# Patient Record
Sex: Male | Born: 1985 | Race: White | Hispanic: No | Marital: Married | Smoking: Current every day smoker
Health system: Southern US, Community
[De-identification: ages and names within clinical notes are randomized; demographics above are authoritative.]

## PROBLEM LIST (undated history)

## (undated) DIAGNOSIS — F32A Depression, unspecified: Secondary | ICD-10-CM

## (undated) DIAGNOSIS — F329 Major depressive disorder, single episode, unspecified: Secondary | ICD-10-CM

## (undated) HISTORY — DX: Major depressive disorder, single episode, unspecified: F32.9

## (undated) HISTORY — DX: Depression, unspecified: F32.A

## (undated) HISTORY — PX: NECK SURGERY: SHX720

---

## 2005-10-04 ENCOUNTER — Emergency Department (HOSPITAL_COMMUNITY): Admission: EM | Admit: 2005-10-04 | Discharge: 2005-10-04 | Payer: Self-pay | Admitting: Family Medicine

## 2006-12-07 ENCOUNTER — Emergency Department (HOSPITAL_COMMUNITY): Admission: EM | Admit: 2006-12-07 | Discharge: 2006-12-07 | Payer: Self-pay | Admitting: Emergency Medicine

## 2006-12-15 ENCOUNTER — Emergency Department (HOSPITAL_COMMUNITY): Admission: EM | Admit: 2006-12-15 | Discharge: 2006-12-15 | Payer: Self-pay | Admitting: Emergency Medicine

## 2007-02-21 ENCOUNTER — Emergency Department (HOSPITAL_COMMUNITY): Admission: EM | Admit: 2007-02-21 | Discharge: 2007-02-21 | Payer: Self-pay | Admitting: Emergency Medicine

## 2007-06-19 ENCOUNTER — Emergency Department (HOSPITAL_COMMUNITY): Admission: EM | Admit: 2007-06-19 | Discharge: 2007-06-19 | Payer: Self-pay | Admitting: Emergency Medicine

## 2011-09-23 LAB — COMPREHENSIVE METABOLIC PANEL
ALT: 19
AST: 32
Albumin: 4.3
CO2: 25
Calcium: 8.6
Creatinine, Ser: 0.96
GFR calc Af Amer: >60
Sodium: 137
Total Bilirubin: 0.6
Total Protein: 7.2

## 2011-09-23 LAB — DIFFERENTIAL
Basophils Absolute: 0
Basophils Relative: 0
Neutro Abs: 3.7
Neutrophils Relative %: 58

## 2011-09-23 LAB — URINALYSIS, ROUTINE W REFLEX MICROSCOPIC
Leukocytes, UA: NEGATIVE
Protein, ur: NEGATIVE
Urobilinogen, UA: 0.2

## 2011-09-23 LAB — LIPASE, BLOOD: Lipase: 19

## 2011-09-23 LAB — URINE MICROSCOPIC-ADD ON

## 2011-09-23 LAB — CBC
MCHC: 34.6
Platelets: 233
RBC: 5.39
WBC: 6.4

## 2015-08-07 ENCOUNTER — Ambulatory Visit (INDEPENDENT_AMBULATORY_CARE_PROVIDER_SITE_OTHER): Payer: Self-pay | Admitting: Family Medicine

## 2015-08-07 VITALS — BP 100/66 | HR 108 | Temp 98.1°F | Resp 16 | Ht 71.0 in | Wt 171.2 lb

## 2015-08-07 DIAGNOSIS — N50819 Testicular pain, unspecified: Secondary | ICD-10-CM

## 2015-08-07 DIAGNOSIS — N508 Other specified disorders of male genital organs: Secondary | ICD-10-CM

## 2015-08-07 DIAGNOSIS — R1030 Lower abdominal pain, unspecified: Secondary | ICD-10-CM

## 2015-08-07 DIAGNOSIS — T148XXA Other injury of unspecified body region, initial encounter: Secondary | ICD-10-CM

## 2015-08-07 DIAGNOSIS — T148 Other injury of unspecified body region: Secondary | ICD-10-CM

## 2015-08-07 LAB — POCT UA - MICROSCOPIC ONLY
Bacteria, U Microscopic: NEGATIVE
Casts, Ur, LPF, POC: NEGATIVE
Crystals, Ur, HPF, POC: NEGATIVE
Epithelial cells, urine per micros: NEGATIVE
Mucus, UA: NEGATIVE
RBC, urine, microscopic: NEGATIVE
WBC, Ur, HPF, POC: NEGATIVE
Yeast, UA: NEGATIVE

## 2015-08-07 LAB — POCT URINALYSIS DIPSTICK
Bilirubin, UA: NEGATIVE
Blood, UA: NEGATIVE
Glucose, UA: NEGATIVE
Ketones, UA: NEGATIVE
Leukocytes, UA: NEGATIVE
Nitrite, UA: NEGATIVE
Protein, UA: NEGATIVE
Spec Grav, UA: 1.01
Urobilinogen, UA: 0.2
pH, UA: 7
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
## 2015-09-11 ENCOUNTER — Telehealth: Payer: Self-pay

## 2015-09-11 DIAGNOSIS — N50811 Right testicular pain: Secondary | ICD-10-CM

## 2015-09-11 NOTE — Telephone Encounter (Signed)
Dr. Conley Rolls, patient states he is not better. Testicles still painful. Wants to know what do next. Cb# [phone].

## 2015-09-12 NOTE — Telephone Encounter (Signed)
Spok ewith patient, intermittent right testicular pain. No n/v/, torsion sxs.

## 2015-11-29 ENCOUNTER — Encounter (HOSPITAL_COMMUNITY): Payer: Self-pay | Admitting: *Deleted

## 2015-11-29 ENCOUNTER — Emergency Department (HOSPITAL_COMMUNITY)
Admission: EM | Admit: 2015-11-29 | Discharge: 2015-11-29 | Disposition: A | Discharge status: Home / Self Care | Payer: Self-pay | Attending: Emergency Medicine | Admitting: Emergency Medicine

## 2015-11-29 DIAGNOSIS — W260XXA Contact with knife, initial encounter: Secondary | ICD-10-CM | POA: Insufficient documentation

## 2015-11-29 DIAGNOSIS — S61214A Laceration without foreign body of right ring finger without damage to nail, initial encounter: Secondary | ICD-10-CM | POA: Insufficient documentation

## 2015-11-29 DIAGNOSIS — S61212A Laceration without foreign body of right middle finger without damage to nail, initial encounter: Secondary | ICD-10-CM | POA: Insufficient documentation

## 2015-11-29 DIAGNOSIS — F17200 Nicotine dependence, unspecified, uncomplicated: Secondary | ICD-10-CM | POA: Insufficient documentation

## 2015-11-29 DIAGNOSIS — Y9389 Activity, other specified: Secondary | ICD-10-CM | POA: Insufficient documentation

## 2015-11-29 DIAGNOSIS — Y999 Unspecified external cause status: Secondary | ICD-10-CM | POA: Insufficient documentation

## 2015-11-29 DIAGNOSIS — S61219A Laceration without foreign body of unspecified finger without damage to nail, initial encounter: Secondary | ICD-10-CM

## 2015-11-29 DIAGNOSIS — Z8659 Personal history of other mental and behavioral disorders: Secondary | ICD-10-CM | POA: Insufficient documentation

## 2015-11-29 DIAGNOSIS — Z79899 Other long term (current) drug therapy: Secondary | ICD-10-CM | POA: Insufficient documentation

## 2015-11-29 DIAGNOSIS — Y9289 Other specified places as the place of occurrence of the external cause: Secondary | ICD-10-CM | POA: Insufficient documentation

## 2015-11-29 MED ORDER — LIDOCAINE HCL (PF) 1 % IJ SOLN
10.0000 mL | Freq: Once | INTRAMUSCULAR | Status: AC
  Administered 2015-11-29: 10 mL
  Filled 2015-11-29: qty 10
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
## 2015-11-29 NOTE — ED Notes (Signed)
Unable to obtain an oral temp
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
## 2015-11-29 NOTE — ED Notes (Signed)
ED PA at the bedside 

## 2015-11-29 NOTE — Discharge Instructions (Signed)
Sutured Wound Care Sutures are stitches that can be used to close wounds. Taking care of your wound properly can help to prevent pain and infection. It can also help your wound to heal more quickly. HOW TO CARE FOR YOUR SUTURED WOUND Wound Care  Keep the wound clean and dry.  If you were given a bandage (dressing), you should change it at least once per day or as directed by your health care provider. You should also change it if it becomes wet or dirty.  Keep the wound completely dry for the first 24 hours or as directed by your health care provider. After that time, you may shower or bathe. However, make sure that the wound is not soaked in water until the sutures have been removed.  Clean the wound one time each day or as directed by your health care provider.  Wash the wound with soap and water.  Rinse the wound with water to remove all soap.  Pat the wound dry with a clean towel. Do not rub the wound.  Aftercleaning the wound, apply a thin layer of antibioticointment as directed by your health care provider. This will help to prevent infection and keep the dressing from sticking to the wound.  Have the sutures removed as directed by your health care provider. General Instructions  Take or apply medicines only as directed by your health care provider.  To help prevent scarring, make sure to cover your wound with sunscreen whenever you are outside after the sutures are removed and the wound is healed. Make sure to wear a sunscreen of at least 30 SPF.  If you were prescribed an antibiotic medicine or ointment, finish all of it even if you start to feel better.  Do not scratch or pick at the wound.  Keep all follow-up visits as directed by your health care provider. This is important.  Check your wound every day for signs of infection. Watch for:   Redness, swelling, or pain.  Fluid, blood, or pus.  Raise (elevate) the injured area above the level of your heart while you  are sitting or lying down, if possible.  Avoid stretching your wound.  Drink enough fluids to keep your urine clear or pale yellow. SEEK MEDICAL CARE IF:  You received a tetanus shot and you have swelling, severe pain, redness, or bleeding at the injection site.  You have a fever.  A wound that was closed breaks open.  You notice a bad smell coming from the wound.  You notice something coming out of the wound, such as wood or glass.  Your pain is not controlled with medicine.  You have increased redness, swelling, or pain at the site of your wound.  You have fluid, blood, or pus coming from your wound.  You notice a change in the color of your skin near your wound.  You need to change the dressing frequently due to fluid, blood, or pus draining from the wound.  You develop a new rash.  You develop numbness around the wound. SEEK IMMEDIATE MEDICAL CARE IF:  You develop severe swelling around the injury site.  Your pain suddenly increases and is severe.  You develop painful lumps near the wound or on skin that is anywhere on your body.  You have a red streak going away from your wound.  The wound is on your hand or foot and you cannot properly move a finger or toe.  The wound is on your hand or foot and   you notice that your fingers or toes look pale or bluish.   This information is not intended to replace advice given to you by your health care provider. Make sure you discuss any questions you have with your health care provider.   Document Released: 01/01/2005 Document Revised: 04/10/2015 Document Reviewed: 07/06/2013 Elsevier Interactive Patient Education 2016 [employer]  

## 2015-12-09 HISTORY — PX: NECK SURGERY: SHX720

## 2016-10-08 ENCOUNTER — Encounter (HOSPITAL_COMMUNITY): Payer: Self-pay | Admitting: Emergency Medicine

## 2016-10-08 ENCOUNTER — Ambulatory Visit (HOSPITAL_COMMUNITY)
Admission: EM | Admit: 2016-10-08 | Discharge: 2016-10-08 | Disposition: A | Discharge status: Home / Self Care | Payer: PRIVATE HEALTH INSURANCE | Attending: Family Medicine | Admitting: Family Medicine

## 2016-10-08 DIAGNOSIS — Z23 Encounter for immunization: Secondary | ICD-10-CM | POA: Diagnosis not present

## 2016-10-08 DIAGNOSIS — S61211A Laceration without foreign body of left index finger without damage to nail, initial encounter: Secondary | ICD-10-CM

## 2016-10-08 MED ORDER — TETANUS-DIPHTH-ACELL PERTUSSIS 5-2.5-18.5 LF-MCG/0.5 IM SUSP
INTRAMUSCULAR | Status: AC
  Filled 2016-10-08: qty 0.5

## 2016-10-08 MED ORDER — TETANUS-DIPHTH-ACELL PERTUSSIS 5-2.5-18.5 LF-MCG/0.5 IM SUSP
0.5000 mL | Freq: Once | INTRAMUSCULAR | Status: AC
  Administered 2016-10-08: 0.5 mL via INTRAMUSCULAR

## 2016-10-08 MED ORDER — CEPHALEXIN 500 MG PO CAPS: 500.0000 mg | ORAL_CAPSULE | Freq: Four times a day (QID) | ORAL | 0 refills | Status: DC

## 2016-10-08 NOTE — ED Triage Notes (Signed)
[facility] with a complaint of a laceration to the first knuckle on his left hand that occurred yesterday with a knife. Bleeding was controlled upon arrival.

## 2016-10-08 NOTE — Discharge Instructions (Signed)
Keep clean and dry. Watch for signs of infection, increased redness, pus, pain, swelling, red streaks, loss of function of the finger. If any of these things occur seek medical attention immediately. Take the antibiotics as directed. Keep the splint on the finger for 3-4 days. Keep it covered while working so as not to contaminate it.
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
## 2016-11-05 ENCOUNTER — Encounter (HOSPITAL_COMMUNITY): Payer: Self-pay

## 2016-11-05 ENCOUNTER — Emergency Department (HOSPITAL_COMMUNITY)
Admission: EM | Admit: 2016-11-05 | Discharge: 2016-11-05 | Disposition: A | Discharge status: Home / Self Care | Payer: Medicaid - Out of State | Attending: Emergency Medicine | Admitting: Emergency Medicine

## 2016-11-05 ENCOUNTER — Emergency Department (HOSPITAL_COMMUNITY): Payer: Medicaid - Out of State

## 2016-11-05 DIAGNOSIS — Z5321 Procedure and treatment not carried out due to patient leaving prior to being seen by health care provider: Secondary | ICD-10-CM | POA: Insufficient documentation

## 2016-11-05 DIAGNOSIS — R112 Nausea with vomiting, unspecified: Secondary | ICD-10-CM | POA: Insufficient documentation

## 2016-11-05 DIAGNOSIS — R2232 Localized swelling, mass and lump, left upper limb: Secondary | ICD-10-CM | POA: Insufficient documentation

## 2016-11-05 LAB — CBC WITH DIFFERENTIAL/PLATELET
BASOS ABS: 0 10*3/uL (ref 0.0–0.1)
Basophils Relative: 0 %
EOS ABS: 0.1 10*3/uL (ref 0.0–0.7)
EOS PCT: 1 %
HCT: 48.7 % (ref 39.0–52.0)
Hemoglobin: 17.7 g/dL — ABNORMAL HIGH (ref 13.0–17.0)
LYMPHS PCT: 12 %
Lymphs Abs: 2 10*3/uL (ref 0.7–4.0)
MCH: 33 pg (ref 26.0–34.0)
MCHC: 36.3 g/dL — ABNORMAL HIGH (ref 30.0–36.0)
MCV: 90.7 fL (ref 78.0–100.0)
MONO ABS: 1.2 10*3/uL — AB (ref 0.1–1.0)
Monocytes Relative: 7 %
Neutro Abs: 12.7 10*3/uL — ABNORMAL HIGH (ref 1.7–7.7)
Neutrophils Relative %: 80 %
PLATELETS: 198 10*3/uL (ref 150–400)
RBC: 5.37 MIL/uL (ref 4.22–5.81)
RDW: 13.4 % (ref 11.5–15.5)
WBC: 16 10*3/uL — AB (ref 4.0–10.5)

## 2016-11-05 LAB — COMPREHENSIVE METABOLIC PANEL
ALBUMIN: 4.6 g/dL (ref 3.5–5.0)
ALK PHOS: 63 U/L (ref 38–126)
ALT: 35 U/L (ref 17–63)
AST: 46 U/L — AB (ref 15–41)
Anion gap: 13 (ref 5–15)
BILIRUBIN TOTAL: 1.1 mg/dL (ref 0.3–1.2)
BUN: 8 mg/dL (ref 6–20)
CO2: 22 mmol/L (ref 22–32)
CREATININE: 0.81 mg/dL (ref 0.61–1.24)
Calcium: 9.6 mg/dL (ref 8.9–10.3)
Chloride: 100 mmol/L — ABNORMAL LOW (ref 101–111)
GFR calc Af Amer: >60 mL/min (ref >60)
GLUCOSE: 134 mg/dL — AB (ref 65–99)
POTASSIUM: 3.2 mmol/L — AB (ref 3.5–5.1)
Sodium: 135 mmol/L (ref 135–145)
TOTAL PROTEIN: 7.4 g/dL (ref 6.5–8.1)

## 2016-11-05 LAB — I-STAT CG4 LACTIC ACID, ED: LACTIC ACID, VENOUS: 2.81 mmol/L — AB (ref 0.5–1.9)

## 2016-11-05 NOTE — ED Notes (Signed)
Called to inform patient test resulted are completed and recommend to follow up with a Doctor. Patient thanked nurse for calling him and will follow up.

## 2016-11-05 NOTE — ED Triage Notes (Signed)
Patient here with left hand index finger swelling, redness and drainage for several days. Patient had laceration to same 1 month ago after cutting on knife. Now is having nausea and vomiting and unsure if related. Left hand warm to touch, no chills. Alert and oriented

## 2016-11-05 NOTE — ED Notes (Signed)
Called for patient in waiting room no answer.

## 2016-11-05 NOTE — ED Notes (Addendum)
Charge nurse at desk patient stated unable to waiting anymore and needs to leave. Nurse first assisting another patient. Nurse first called for patient no answer in waiting room.

## 2017-01-28 ENCOUNTER — Encounter (HOSPITAL_COMMUNITY): Payer: Self-pay | Admitting: *Deleted

## 2017-01-28 ENCOUNTER — Emergency Department (HOSPITAL_COMMUNITY)
Admission: EM | Admit: 2017-01-28 | Discharge: 2017-01-28 | Disposition: A | Discharge status: Home / Self Care | Payer: PRIVATE HEALTH INSURANCE | Attending: Emergency Medicine | Admitting: Emergency Medicine

## 2017-01-28 ENCOUNTER — Emergency Department (HOSPITAL_COMMUNITY): Payer: PRIVATE HEALTH INSURANCE

## 2017-01-28 DIAGNOSIS — Y939 Activity, unspecified: Secondary | ICD-10-CM | POA: Insufficient documentation

## 2017-01-28 DIAGNOSIS — S62639A Displaced fracture of distal phalanx of unspecified finger, initial encounter for closed fracture: Secondary | ICD-10-CM

## 2017-01-28 DIAGNOSIS — W230XXA Caught, crushed, jammed, or pinched between moving objects, initial encounter: Secondary | ICD-10-CM | POA: Insufficient documentation

## 2017-01-28 DIAGNOSIS — Y929 Unspecified place or not applicable: Secondary | ICD-10-CM | POA: Insufficient documentation

## 2017-01-28 DIAGNOSIS — Z79899 Other long term (current) drug therapy: Secondary | ICD-10-CM | POA: Insufficient documentation

## 2017-01-28 DIAGNOSIS — Y999 Unspecified external cause status: Secondary | ICD-10-CM | POA: Insufficient documentation

## 2017-01-28 DIAGNOSIS — S62632A Displaced fracture of distal phalanx of right middle finger, initial encounter for closed fracture: Secondary | ICD-10-CM | POA: Insufficient documentation

## 2017-01-28 DIAGNOSIS — F17210 Nicotine dependence, cigarettes, uncomplicated: Secondary | ICD-10-CM | POA: Insufficient documentation

## 2017-01-28 MED ORDER — OXYCODONE-ACETAMINOPHEN 5-325 MG PO TABS: 1.0000 | ORAL_TABLET | Freq: Once | ORAL | Status: DC

## 2017-01-28 MED ORDER — HYDROCODONE-ACETAMINOPHEN 5-325 MG PO TABS
1.0000 | ORAL_TABLET | Freq: Four times a day (QID) | ORAL | 0 refills | Status: AC | PRN
Start: 2017-01-28 — End: ?

## 2017-01-28 MED ORDER — CEPHALEXIN 500 MG PO CAPS: 500.0000 mg | ORAL_CAPSULE | Freq: Four times a day (QID) | ORAL | 0 refills | Status: AC

## 2017-01-28 MED ORDER — OXYCODONE-ACETAMINOPHEN 5-325 MG PO TABS
ORAL_TABLET | ORAL | Status: AC
  Filled 2017-01-28: qty 1

## 2017-01-28 MED ORDER — OXYCODONE-ACETAMINOPHEN 5-325 MG PO TABS
1.0000 | ORAL_TABLET | Freq: Once | ORAL | Status: AC
  Administered 2017-01-28: 1 via ORAL
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
## 2017-01-28 NOTE — ED Notes (Signed)
ED Provider at bedside. 

## 2017-01-28 NOTE — Progress Notes (Signed)
Orthopedic Tech Progress Note Patient Details:  John SchwalbeGordon Cordova 03-11-1986 [id number]  Ortho Devices Type of Ortho Device: Finger splint Ortho Device/Splint Location: RUE middle finger Ortho Device/Splint Interventions: Ordered, Application   [name], [name] 01/28/2017, 8:20 PM

## 2017-01-28 NOTE — ED Provider Notes (Signed)
[facility] Note   CSN: [id number] Arrival date & time: 01/28/17  1829  By signing my name below, I, John Cordova, attest that this documentation has been prepared under the direction and in the presence of [name], NP. Electronically Signed: Alyssa GroveMartin Cordova, ED Scribe. 01/28/17. 7:35 PM.  History   Chief Complaint Chief Complaint  Patient presents with  . Finger Injury   The history is provided by the patient. No language interpreter was used.   HPI Comments: John Cordova is a 31 y.o. male who presents to the Emergency Department complaining of constant, moderate right middle finger pain s/p injury earlier today. Pt's finger was "smashed with a hammer". He placed a hole through his nail in order to drain the blood under his nail which provided moderate relief to pain. Pt reports associated swelling and color change.   Past Medical History:  Diagnosis Date  . Depression     There are no active problems to display for this patient.   History reviewed. No pertinent surgical history.     Home Medications    Prior to Admission medications   Medication Sig Start Date End Date Taking? Authorizing Provider  Ascorbic Acid (VITAMIN C PO) Take 1 tablet by mouth daily.    Historical Provider, MD  cephALEXin (KEFLEX) 500 MG capsule Take 1 capsule (500 mg total) by mouth 4 (four) times daily. 01/28/17   Hope Orlene OchM [name], NP  Cholecalciferol (VITAMIN D PO) Take 1 tablet by mouth daily.    Historical Provider, MD  HYDROcodone-acetaminophen (NORCO) 5-325 MG tablet Take 1 tablet by mouth every 6 (six) hours as needed. 01/28/17   Hope Orlene OchM [name], NP  MAGNESIUM PO Take 1 tablet by mouth daily.    Historical Provider, MD  Multiple Vitamin (MULTIVITAMIN WITH MINERALS) TABS tablet Take 1 tablet by mouth daily.    Historical Provider, MD  NIACIN PO Take 1 tablet by mouth daily.    Historical Provider, MD  VITAMIN E PO Take 1 tablet by mouth daily.    Historical Provider, MD     Family History No family history on file.  Social History Social History  Substance Use Topics  . Smoking status: Current Every Day Smoker    Packs/day: 1.00    Types: Cigarettes  . Smokeless tobacco: Never Used  . Alcohol use No     Allergies   Patient has no known allergies.   Review of Systems Review of Systems  Musculoskeletal: Positive for arthralgias and joint swelling.  Skin: Positive for color change and wound.  All other systems reviewed and are negative.   Physical Exam Updated Vital Signs BP 138/72 (BP Location: Left Arm)   Pulse 81   Temp 98.2 F (36.8 C) (Oral)   Resp 18   Ht 5\' 11"  (1.803 m)   Wt 78.5 kg   SpO2 98%   BMI 24.13 kg/m   Physical Exam  Constitutional: He is oriented to person, place, and time. He appears well-developed and well-nourished. He is active. No distress.  HENT:  Head: Normocephalic and atraumatic.  Eyes: Conjunctivae are normal.  Cardiovascular: Normal rate.   Adequate circulation Subungual hematoma that he drained   Pulmonary/Chest: Effort normal. No respiratory distress.  Musculoskeletal:       Right hand: He exhibits tenderness, bony tenderness and swelling. He exhibits normal capillary refill. Decreased range of motion: due to pain. Normal sensation noted. Normal strength noted.  Middle finger of right hand with subungual hematoma that patient  has drained himself. Good strength. No focal neuro deficits.   Neurological: He is alert and oriented to person, place, and time.  Skin: Skin is warm and dry.  Psychiatric: He has a normal mood and affect. His behavior is normal.  Nursing note and vitals reviewed.   ED Treatments / Results  DIAGNOSTIC STUDIES: Oxygen Saturation is 100% on RA, normal by my interpretation.    COORDINATION OF CARE: 7:29 PM Discussed treatment plan with pt at bedside which includes referral to hand and antibiotics and pt agreed to plan.  Labs (all labs ordered are listed, but only  abnormal results are displayed) Labs Reviewed - No data to display  Radiology No results found.  Procedures Procedures (including critical care time)  Medications Ordered in ED Medications  oxyCODONE-acetaminophen (PERCOCET/ROXICET) 5-325 MG per tablet 1 tablet (1 tablet Oral Given 01/28/17 1853)     Initial Impression / Assessment and Plan / ED Course  I have reviewed the triage vital signs and the nursing notes.  Pertinent imaging results that were available during my care of the patient were reviewed by me and considered in my medical decision making (see chart for details).   I personally performed the services described in this documentation, which was scribed in my presence. The recorded information has been reviewed and is accurate  Final Clinical Impressions(s) / ED Diagnoses  31 y.o. male with pain and swelling of the right middle finger after he hit his finger with a hammer while at work stable for d/c without focal neuro deficits. Finger splinted and pain management, he will elevate and ice the area. Referral to hand.  Final diagnoses:  Closed fracture of tuft of distal phalanx of finger    New Prescriptions Discharge Medication List as of 01/28/2017  7:56 PM    START taking these medications   Details  HYDROcodone-acetaminophen (NORCO) 5-325 MG tablet Take 1 tablet by mouth every 6 (six) hours as needed., Starting Wed 01/28/2017, Print         [place], NP 02/01/17 1653    Arby Barrette, MD 02/02/17 1630

## 2017-01-28 NOTE — Discharge Instructions (Signed)
You can take Advil in addition to the narcotic pain medication. Do not drive while taking the narcotic as it will make you sleepy. Call Dr. Glenna Durandrtmann's office for follow up. Return here as needed.

## 2017-04-30 ENCOUNTER — Encounter (HOSPITAL_COMMUNITY): Payer: Self-pay

## 2017-04-30 DIAGNOSIS — Y9389 Activity, other specified: Secondary | ICD-10-CM | POA: Diagnosis not present

## 2017-04-30 DIAGNOSIS — Y929 Unspecified place or not applicable: Secondary | ICD-10-CM | POA: Diagnosis not present

## 2017-04-30 DIAGNOSIS — Y990 Civilian activity done for income or pay: Secondary | ICD-10-CM | POA: Diagnosis not present

## 2017-04-30 DIAGNOSIS — S0591XA Unspecified injury of right eye and orbit, initial encounter: Secondary | ICD-10-CM | POA: Insufficient documentation

## 2017-04-30 DIAGNOSIS — W274XXA Contact with kitchen utensil, initial encounter: Secondary | ICD-10-CM | POA: Insufficient documentation

## 2017-04-30 MED ORDER — FLUORESCEIN SODIUM 0.6 MG OP STRP: 1.0000 | ORAL_STRIP | Freq: Once | OPHTHALMIC | Status: DC

## 2017-04-30 MED ORDER — TETRACAINE HCL 0.5 % OP SOLN: 2.0000 [drp] | Freq: Once | OPHTHALMIC | Status: DC

## 2017-04-30 NOTE — ED Notes (Signed)
Asking how much longer wait

## 2017-04-30 NOTE — ED Triage Notes (Signed)
Pt reports right eye irritation and redness for three days after using a grinder at work. States possible metal or brick could have gotten into his eye. He reports pain with blinking.

## 2017-05-01 ENCOUNTER — Emergency Department (HOSPITAL_COMMUNITY)
Admission: EM | Admit: 2017-05-01 | Discharge: 2017-05-01 | Disposition: A | Discharge status: Home / Self Care | Payer: Medicaid - Out of State | Attending: Emergency Medicine | Admitting: Emergency Medicine

## 2017-05-01 NOTE — ED Notes (Signed)
No answer when called x 3.  

## 2017-05-05 ENCOUNTER — Emergency Department (HOSPITAL_COMMUNITY)
Admission: EM | Admit: 2017-05-05 | Discharge: 2017-05-06 | Disposition: A | Discharge status: Home / Self Care | Payer: PRIVATE HEALTH INSURANCE | Attending: Emergency Medicine | Admitting: Emergency Medicine

## 2017-05-05 ENCOUNTER — Encounter (HOSPITAL_COMMUNITY): Payer: Self-pay | Admitting: Emergency Medicine

## 2017-05-05 DIAGNOSIS — H5711 Ocular pain, right eye: Secondary | ICD-10-CM | POA: Insufficient documentation

## 2017-05-05 MED ORDER — FLUORESCEIN SODIUM 0.6 MG OP STRP
1.0000 | ORAL_STRIP | Freq: Once | OPHTHALMIC | Status: DC
  Filled 2017-05-05: qty 1

## 2017-05-05 MED ORDER — TETRACAINE HCL 0.5 % OP SOLN
2.0000 [drp] | Freq: Once | OPHTHALMIC | Status: DC
  Filled 2017-05-05: qty 2

## 2017-05-05 NOTE — ED Triage Notes (Signed)
Pt c/o pain to right eye x's 1 week after using a grinder at work.  Pt st's he was here a few days ago but left due to the wait time and thought it would get better.  Pt st's not any better

## 2017-05-05 NOTE — ED Provider Notes (Cosign Needed)
[facility] Provider Note   CSN: [id number] Arrival date & time: 05/05/17  2149  By signing my name below, I, John Cordova, attest that this documentation has been prepared under the direction and in the presence of non-physician practitioner, Kerrie Buffalo, NP. Electronically Signed: Rosana Cordova, ED Scribe. 05/05/17. 10:51 PM.  History   Chief Complaint Chief Complaint  Patient presents with  . Eye Pain   The history is provided by the patient. No language interpreter was used.  Eye Pain    HPI Comments: John Cordova is a 31 y.o. male who presents to the Emergency Department complaining of constant, gradually worsening right eye pain onset 1 week ago. Pt states he may have a foreign body of glass or brick in his eye. Pt was here a couple of days ago but left due to the wait time and was not seen. Pt reports associated visual disturbance. No treatments tried prior to arrival in the ED. Tetanus is UTD. No other complaints at this time.  Past Medical History:  Diagnosis Date  . Depression     There are no active problems to display for this patient.   Past Surgical History:  Procedure Laterality Date  . NECK SURGERY  2017   repair of laceration/arterial repair       Home Medications    Prior to Admission medications   Medication Sig Start Date End Date Taking? Authorizing Provider  Ascorbic Acid (VITAMIN C PO) Take 1 tablet by mouth daily.    [provider]  cephALEXin (KEFLEX) 500 MG capsule Take 1 capsule (500 mg total) by mouth 4 (four) times daily. 01/28/17   John Napoleon, NP  Cholecalciferol (VITAMIN D PO) Take 1 tablet by mouth daily.    [provider]  HYDROcodone-acetaminophen (NORCO) 5-325 MG tablet Take 1 tablet by mouth every 6 (six) hours as needed. 01/28/17   John Napoleon, NP  MAGNESIUM PO Take 1 tablet by mouth daily.    [provider]  Multiple Vitamin (MULTIVITAMIN WITH MINERALS) TABS tablet Take 1 tablet by mouth  daily.    [provider]  NIACIN PO Take 1 tablet by mouth daily.    [provider]  VITAMIN E PO Take 1 tablet by mouth daily.    [provider]    Family History No family history on file.  Social History Social History  Substance Use Topics  . Smoking status: Current Every Day Smoker    Packs/day: 1.00    Types: Cigarettes  . Smokeless tobacco: Never Used  . Alcohol use No     Allergies   Patient has no known allergies.   Review of Systems Review of Systems  Eyes: Positive for photophobia, pain, discharge, redness and visual disturbance.  Gastrointestinal: Negative for nausea.  Neurological: Negative for syncope.     Physical Exam Updated Vital Signs BP 135/86   Pulse 94   Temp 98.5 F (36.9 C) (Oral)   Resp 18   Ht 5' 11.5" (1.816 m)   Wt 165 lb (74.8 kg)   SpO2 99%   BMI 22.69 kg/m   Physical Exam  Constitutional: He appears well-developed and well-nourished. No distress.  HENT:  Head: Normocephalic and atraumatic.  Eyes: EOM are normal. Right conjunctiva is injected.  Cardiovascular: Normal rate.   Pulmonary/Chest: Effort normal.  Neurological: He is alert.  Skin: Skin is warm and dry.  Psychiatric: He has a normal mood and affect.  Nursing note and vitals reviewed.  ED Treatments / Results  DIAGNOSTIC STUDIES: Oxygen Saturation is 99% on RA, normal by my interpretation.   COORDINATION OF CARE: 10:50 PM-Discussed next steps with pt including visual acuity, using eye drop to stop the pain, eye exam with slit lamp and staining the eye to look for corneal abrasion and/or foreign body. Pt verbalized understanding and is agreeable with the plan.   11:45 PM Medications arrived from pharmacy, return to the pt's room. Upon arrival, the pt was no longer in his room.   Labs (all labs ordered are listed, but only abnormal results are displayed) Labs Reviewed - No data to display  Radiology No results  found.  Procedures Procedures (including critical care time)  Medications Ordered in ED Medications - No data to display   Initial Impression / Assessment and Plan / ED Course  I have reviewed the triage vital signs and the nursing notes.  Final Clinical Impressions(s) / ED Diagnoses  Right eye redness and pain. Patient left prior to eye exam.  New Prescriptions New Prescriptions   No medications on file  I personally performed the services described in this documentation, which was scribed in my presence. The recorded information has been reviewed and is accurate.    Kerrie Buffaloeese, [place] Windthorst[place], [place] 05/06/17 [phone]

## 2017-05-06 ENCOUNTER — Encounter (HOSPITAL_COMMUNITY): Payer: Self-pay | Admitting: Emergency Medicine

## 2017-05-06 ENCOUNTER — Emergency Department (HOSPITAL_COMMUNITY)
Admission: EM | Admit: 2017-05-06 | Discharge: 2017-05-06 | Disposition: A | Discharge status: Home / Self Care | Payer: PRIVATE HEALTH INSURANCE | Attending: Emergency Medicine | Admitting: Emergency Medicine

## 2017-05-06 DIAGNOSIS — S0501XA Injury of conjunctiva and corneal abrasion without foreign body, right eye, initial encounter: Secondary | ICD-10-CM

## 2017-05-06 DIAGNOSIS — Y9389 Activity, other specified: Secondary | ICD-10-CM | POA: Insufficient documentation

## 2017-05-06 DIAGNOSIS — S0591XA Unspecified injury of right eye and orbit, initial encounter: Secondary | ICD-10-CM

## 2017-05-06 DIAGNOSIS — Y990 Civilian activity done for income or pay: Secondary | ICD-10-CM | POA: Insufficient documentation

## 2017-05-06 DIAGNOSIS — Y929 Unspecified place or not applicable: Secondary | ICD-10-CM | POA: Insufficient documentation

## 2017-05-06 DIAGNOSIS — W228XXA Striking against or struck by other objects, initial encounter: Secondary | ICD-10-CM | POA: Insufficient documentation

## 2017-05-06 MED ORDER — TETRACAINE HCL 0.5 % OP SOLN
2.0000 [drp] | Freq: Once | OPHTHALMIC | Status: AC
  Administered 2017-05-06: 2 [drp] via OPHTHALMIC
  Filled 2017-05-06: qty 2

## 2017-05-06 MED ORDER — ERYTHROMYCIN 5 MG/GM OP OINT: TOPICAL_OINTMENT | OPHTHALMIC | 0 refills | Status: AC

## 2017-05-06 MED ORDER — FLUORESCEIN SODIUM 0.6 MG OP STRP
1.0000 | ORAL_STRIP | Freq: Once | OPHTHALMIC | Status: AC
  Administered 2017-05-06: 1 via OPHTHALMIC
  Filled 2017-05-06: qty 1

## 2017-05-06 NOTE — ED Notes (Signed)
Pt left prior to eye exam.  Pt not in room or in bathrooms near room.  Pt seems to have left.

## 2017-05-06 NOTE — ED Provider Notes (Signed)
[facility] Provider Note   CSN: [id number] Arrival date & time: 05/06/17  1318  By signing my name below, I, John Cordova, attest that this documentation has been prepared under the direction and in the presence of Sherria Riemann, PA-C.  Electronically Signed: Rosario AdieWilliam Andrew Cordova, ED Scribe. 05/06/17. 3:50 PM.  History   Chief Complaint Chief Complaint  Patient presents with  . Eye Pain   The history is provided by the patient. No language interpreter was used.    HPI Comments: John Cordova is a 31 y.o. male who presents to the Emergency Department complaining of sudden onset, persistent right eye pain beginning a week ago. Per pt, he was working doing Holiday representativeconstruction on a brick wall when he felt something fly into his eye. His pain has increased since this time, currently rated 8 out of 10. Endorses associated photophobia, eye fatigue, and occasional diplopia. He states he was seen yesterday but had to leave prior to being fully evaluated due to a family emergency. Denies contact lens wear, previous eye injury, or eye surgery. Denies eye drainage, periorbital swelling, fever, or any other complaints.     Past Medical History:  Diagnosis Date  . Depression    There are no active problems to display for this patient.  Past Surgical History:  Procedure Laterality Date  . NECK SURGERY  2017   repair of laceration/arterial repair    Home Medications    Prior to Admission medications   Medication Sig Start Date End Date Taking? Authorizing Provider  Multiple Vitamin (MULTIVITAMIN WITH MINERALS) TABS tablet Take 1 tablet by mouth daily.   Yes [provider]  ophthalmic irrigation solution ophthalmic solution Place 1 drop into the right eye as needed.   Yes [provider]  Ascorbic Acid (VITAMIN C PO) Take 1 tablet by mouth daily.    [provider]  cephALEXin (KEFLEX) 500 MG capsule Take 1 capsule (500 mg total) by mouth 4 (four) times  daily. Patient not taking: Reported on 05/06/2017 01/28/17   Janne NapoleonNeese, Hope M, NP  erythromycin ophthalmic ointment Place a 1/2 inch ribbon of ointment into the lower eyelid four times a day for five days. 05/06/17   Paislei Dorval C, PA-C  HYDROcodone-acetaminophen (NORCO) 5-325 MG tablet Take 1 tablet by mouth every 6 (six) hours as needed. Patient not taking: Reported on 05/06/2017 01/28/17   Janne NapoleonNeese, Hope M, NP   Family History History reviewed. No pertinent family history.  Social History Social History  Substance Use Topics  . Smoking status: Current Every Day Smoker    Packs/day: 1.00    Types: Cigarettes  . Smokeless tobacco: Never Used  . Alcohol use No   Allergies   Patient has no known allergies.  Review of Systems Review of Systems  Constitutional: Negative for fever.  Eyes: Positive for photophobia, pain, redness and visual disturbance. Negative for discharge.  Skin: Negative for wound.  Neurological: Negative for dizziness, light-headedness and headaches.   Physical Exam Updated Vital Signs BP 109/61 (BP Location: Right Arm)   Pulse 92   Temp 98.5 F (36.9 C) (Oral)   Resp 18   SpO2 100%   Physical Exam  Constitutional: He appears well-developed and well-nourished. No distress.  HENT:  Head: Normocephalic and atraumatic.  Eyes: EOM are normal. Pupils are equal, round, and reactive to light.  No pain with consensual constriction of the pupils. There is scleral injection to the right eye. No pain with EOMs.   No contact  lenses in place.  Woods Lamp exam shows an area of fluorescein uptake at around the 9:00 position on the right cornea consistent with a corneal abrasion. There is another, smaller area medial to this that has more fluorescein uptake and is consistent with a possible foreign body.  Tono-Pen values: Right eye: 15  Left eye: 14    Visual Acuity  Right Eye Distance: 20/32 Left Eye Distance: 20/40 Bilateral Distance: 20/32  Right Eye Near:   Left Eye  Near:    Bilateral Near:      Neck: Neck supple.  Cardiovascular: Normal rate and regular rhythm.   Pulmonary/Chest: Effort normal.  Musculoskeletal: He exhibits no edema.  Lymphadenopathy:    He has no cervical adenopathy.  Neurological: He is alert.  Skin: Skin is warm and dry. No pallor.  Psychiatric: He has a normal mood and affect. His behavior is normal.  Nursing note and vitals reviewed.  ED Treatments / Results  DIAGNOSTIC STUDIES: Oxygen Saturation is 100% on RA, normal by my interpretation.   COORDINATION OF CARE: 3:45 PM-Discussed next steps with pt. Pt verbalized understanding and is agreeable with the plan.   Labs (all labs ordered are listed, but only abnormal results are displayed) Labs Reviewed - No data to display  EKG  EKG Interpretation None      Radiology No results found.  Procedures Procedures   Medications Ordered in ED Medications  fluorescein ophthalmic strip 1 strip (1 strip Right Eye Given 05/06/17 1712)  tetracaine (PONTOCAINE) 0.5 % ophthalmic solution 2 drop (2 drops Both Eyes Given by Other 05/06/17 1711)    Initial Impression / Assessment and Plan / ED Course  I have reviewed the triage vital signs and the nursing notes.  Pertinent labs & imaging results that were available during my care of the patient were reviewed by me and considered in my medical decision making (see chart for details).  Clinical Course as of May 06 1809  Wed May 06, 2017  1746 Spoke with Dr. Sherryll Burger, ophthalmologist, who agreed with my assessment that the patient may be seen in the office first thing tomorrow morning. States patient may arrive between 56 and 8 AM. He will definitely need to be at the office before noon, as Dr. Sherryll Burger is in the OR in the afternoon. Requests patient be placed on erythromycin ointment.  [SJ]    Clinical Course User Index [SJ] Marika Mahaffy C, PA-C     Patient presents with pain from an eye injury. Corneal abrasion with possible  retained foreign body. Unable to fully visualize and evaluate this patient's eye due to malfunction in our slit-lamp equipment. Ophthalmology follow-up. Instructions given to the patient. Possible risks of failing to follow-up with ophthalmology were discussed. Patient repeated instructions back and voiced understanding.     Final Clinical Impressions(s) / ED Diagnoses   Final diagnoses:  Right eye injury, initial encounter  Abrasion of right cornea, initial encounter   New Prescriptions New Prescriptions   ERYTHROMYCIN OPHTHALMIC OINTMENT    Place a 1/2 inch ribbon of ointment into the lower eyelid four times a day for five days.   I personally performed the services described in this documentation, which was scribed in my presence. The recorded information has been reviewed and is accurate.    Anselm Pancoast, PA-C 05/06/17 1811    Vanetta Mulders, MD 05/07/17 1130

## 2017-05-06 NOTE — Discharge Instructions (Signed)
You have evidence of a corneal abrasion and possible foreign body in the eye. Please go to the ophthalmologist office tomorrow. Ideally, go first thing in the morning at 7:45 AM. Call the office as soon as possible if there is a change in plans. Apply the erythromycin ointment to the affected eye 4 times a day for the next 5 days.

## 2017-05-06 NOTE — ED Triage Notes (Signed)
Pt sts got something into right eye 3 days ago; pt sts pain

## 2017-12-07 ENCOUNTER — Emergency Department (HOSPITAL_COMMUNITY)
Admission: EM | Admit: 2017-12-07 | Discharge: 2017-12-07 | Disposition: A | Discharge status: Home / Self Care | Payer: Self-pay | Attending: Emergency Medicine | Admitting: Emergency Medicine

## 2017-12-07 ENCOUNTER — Other Ambulatory Visit: Payer: Self-pay

## 2017-12-07 ENCOUNTER — Encounter (HOSPITAL_COMMUNITY): Payer: Self-pay | Admitting: Emergency Medicine

## 2017-12-07 ENCOUNTER — Emergency Department (HOSPITAL_COMMUNITY): Payer: Self-pay

## 2017-12-07 DIAGNOSIS — Z79899 Other long term (current) drug therapy: Secondary | ICD-10-CM | POA: Insufficient documentation

## 2017-12-07 DIAGNOSIS — F17210 Nicotine dependence, cigarettes, uncomplicated: Secondary | ICD-10-CM | POA: Insufficient documentation

## 2017-12-07 DIAGNOSIS — R42 Dizziness and giddiness: Secondary | ICD-10-CM | POA: Insufficient documentation

## 2017-12-07 LAB — CBC WITH DIFFERENTIAL/PLATELET
Basophils Absolute: 0 10*3/uL (ref 0.0–0.1)
Basophils Relative: 0 %
Eosinophils Absolute: 0.3 10*3/uL (ref 0.0–0.7)
Eosinophils Relative: 5 %
HEMATOCRIT: 42 % (ref 39.0–52.0)
HEMOGLOBIN: 15 g/dL (ref 13.0–17.0)
LYMPHS ABS: 2 10*3/uL (ref 0.7–4.0)
LYMPHS PCT: 37 %
MCH: 32.7 pg (ref 26.0–34.0)
MCHC: 35.7 g/dL (ref 30.0–36.0)
MCV: 91.5 fL (ref 78.0–100.0)
Monocytes Absolute: 0.5 10*3/uL (ref 0.1–1.0)
Monocytes Relative: 9 %
NEUTROS PCT: 49 %
Neutro Abs: 2.6 10*3/uL (ref 1.7–7.7)
Platelets: 190 10*3/uL (ref 150–400)
RBC: 4.59 MIL/uL (ref 4.22–5.81)
RDW: 12.8 % (ref 11.5–15.5)
WBC: 5.3 10*3/uL (ref 4.0–10.5)

## 2017-12-07 LAB — COMPREHENSIVE METABOLIC PANEL
ALK PHOS: 46 U/L (ref 38–126)
ALT: 14 U/L — AB (ref 17–63)
AST: 23 U/L (ref 15–41)
Albumin: 3.6 g/dL (ref 3.5–5.0)
Anion gap: 7 (ref 5–15)
BUN: 10 mg/dL (ref 6–20)
CALCIUM: 8.9 mg/dL (ref 8.9–10.3)
CO2: 26 mmol/L (ref 22–32)
CREATININE: 0.85 mg/dL (ref 0.61–1.24)
Chloride: 105 mmol/L (ref 101–111)
Glucose, Bld: 110 mg/dL — ABNORMAL HIGH (ref 65–99)
Potassium: 3.6 mmol/L (ref 3.5–5.1)
Sodium: 138 mmol/L (ref 135–145)
Total Bilirubin: 0.9 mg/dL (ref 0.3–1.2)
Total Protein: 6.1 g/dL — ABNORMAL LOW (ref 6.5–8.1)

## 2017-12-07 MED ORDER — SODIUM CHLORIDE 0.9 % IV BOLUS (SEPSIS)
1000.0000 mL | Freq: Once | INTRAVENOUS | Status: AC
  Administered 2017-12-07: 1000 mL via INTRAVENOUS

## 2017-12-07 NOTE — ED Triage Notes (Signed)
Pt c/o shortness of breath x 1 week and diarrhea since this morning. Pt c/o headache since this morning. 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

## 2017-12-07 NOTE — ED Provider Notes (Signed)
[facility]-EMERGENCY DEPT Provider Note   CSN: [id number] Arrival date & time: 12/07/17  1644     History   Chief Complaint Chief Complaint  Patient presents with  . Shortness of Breath  . Toxic Inhalation  . Dizziness    HPI John Cordova is a 31 y.o. male.  Patient states that he has had diarrhea for couple days and is got a mild cough.  He is afraid he may have inhaled some fumes at work.  No fever no chills mild aches   The history is provided by the patient.  Shortness of Breath  This is a new problem. The problem occurs intermittently.The current episode started 2 days ago. The problem has not changed since onset.Associated symptoms include cough. Pertinent negatives include no fever, no headaches, no chest pain, no abdominal pain and no rash. The problem's precipitants include fumes. He has tried nothing for the symptoms. The treatment provided no relief.  Dizziness  Associated symptoms: diarrhea and shortness of breath   Associated symptoms: no chest pain and no headaches     Past Medical History:  Diagnosis Date  . Depression     There are no active problems to display for this patient.   Past Surgical History:  Procedure Laterality Date  . NECK SURGERY  2017   repair of laceration/arterial repair  . NECK SURGERY         Home Medications    Prior to Admission medications   Medication Sig Start Date End Date Taking? Authorizing Provider  Ascorbic Acid (VITAMIN C PO) Take 1 tablet by mouth daily.   Yes [provider]  cephALEXin (KEFLEX) 500 MG capsule Take 1 capsule (500 mg total) by mouth 4 (four) times daily. Patient not taking: Reported on 05/06/2017 01/28/17   Janne NapoleonNeese, Hope M, NP  erythromycin ophthalmic ointment Place a 1/2 inch ribbon of ointment into the lower eyelid four times a day for five days. Patient not taking: Reported on 12/07/2017 05/06/17   Joy, Hillard DankerShawn C, PA-C  HYDROcodone-acetaminophen (NORCO) 5-325 MG  tablet Take 1 tablet by mouth every 6 (six) hours as needed. Patient not taking: Reported on 05/06/2017 01/28/17   Janne NapoleonNeese, Hope M, NP    Family History History reviewed. No pertinent family history.  Social History Social History   Tobacco Use  . Smoking status: Current Every Day Smoker    Packs/day: 1.00    Types: Cigarettes  . Smokeless tobacco: Never Used  Substance Use Topics  . Alcohol use: No    Alcohol/week: 0.0 oz  . Drug use: No     Allergies   Patient has no known allergies.   Review of Systems Review of Systems  Constitutional: Negative for appetite change, fatigue and fever.  HENT: Negative for congestion, ear discharge and sinus pressure.   Eyes: Negative for discharge.  Respiratory: Positive for cough and shortness of breath.   Cardiovascular: Negative for chest pain.  Gastrointestinal: Positive for diarrhea. Negative for abdominal pain.  Genitourinary: Negative for frequency and hematuria.  Musculoskeletal: Negative for back pain.  Skin: Negative for rash.  Neurological: Positive for dizziness. Negative for seizures and headaches.  Psychiatric/Behavioral: Negative for hallucinations.     Physical Exam Updated Vital Signs BP 117/76 (BP Location: Left Arm)   Pulse 67   Temp 98.2 F (36.8 C) (Oral)   Resp 20   SpO2 100%   Physical Exam  Constitutional: He is oriented to person, place, and time. He appears well-developed.  HENT:  Head: Normocephalic.  Eyes: Conjunctivae and EOM are normal. No scleral icterus.  Neck: Neck supple. No thyromegaly present.  Cardiovascular: Normal rate and regular rhythm. Exam reveals no gallop and no friction rub.  No murmur heard. Pulmonary/Chest: No stridor. He has no wheezes. He has no rales. He exhibits no tenderness.  Abdominal: He exhibits no distension. There is no tenderness. There is no rebound.  Musculoskeletal: Normal range of motion. He exhibits no edema.  Lymphadenopathy:    He has no cervical  adenopathy.  Neurological: He is oriented to person, place, and time. He exhibits normal muscle tone. Coordination normal.  Skin: No rash noted. No erythema.  Psychiatric: He has a normal mood and affect. His behavior is normal.     ED Treatments / Results  Labs (all labs ordered are listed, but only abnormal results are displayed) Labs Reviewed  COMPREHENSIVE METABOLIC PANEL - Abnormal; Notable for the following components:      Result Value   Glucose, Bld 110 (*)    Total Protein 6.1 (*)    ALT 14 (*)    All other components within normal limits  CBC WITH DIFFERENTIAL/PLATELET    EKG  EKG Interpretation None       Radiology Dg Chest 2 View  Result Date: 12/07/2017 CLINICAL DATA:  Dyspnea for 1 week with productive cough and central chest pain. EXAM: CHEST  2 VIEW COMPARISON:  None. FINDINGS: The heart size and mediastinal contours are within normal limits. Both lungs are clear. The visualized skeletal structures are unremarkable. IMPRESSION: No active cardiopulmonary disease. Electronically Signed   By: Tollie Ethavid  Kwon M.D.   On: 12/07/2017 17:18   Dg Abdomen 1 View  Result Date: 12/07/2017 CLINICAL DATA:  Severe diarrhea this morning. EXAM: ABDOMEN - 1 VIEW COMPARISON:  06/19/2007 CT FINDINGS: Nonspecific bowel gas pattern with air containing non distended small and large bowel loops noted. Small-bowel loops in the left hemiabdomen may reflect stigmata of a mild enteritis given slight submucosal and possible fold thickening. No acute osseous abnormality. No radiopaque calculi. IMPRESSION: There is a nonspecific bowel gas pattern without evidence of obstruction. Possible slight submucosal and/or fold thickening involving segments of small bowel in the left hemiabdomen raising the possibility of a small bowel enteritis. Electronically Signed   By: Tollie Ethavid  Kwon M.D.   On: 12/07/2017 21:45    Procedures Procedures (including critical care time)  Medications Ordered in  ED Medications  sodium chloride 0.9 % bolus 1,000 mL (0 mLs Intravenous Stopped 12/07/17 2334)     Initial Impression / Assessment and Plan / ED Course  I have reviewed the triage vital signs and the nursing notes.  Pertinent labs & imaging results that were available during my care of the patient were reviewed by me and considered in my medical decision making (see chart for details).   Patient felt better with IV fluids.  Diagnosis viral syndrome.  He will drink plenty of fluids and take Tylenol or Motrin and follow-up as needed   Final Clinical Impressions(s) / ED Diagnoses   Final diagnoses:  Dizziness    ED Discharge Orders    None       Bethann BerkshireZammit, Susie Ehresman, MD 12/07/17 2341

## 2017-12-07 NOTE — ED Notes (Addendum)
Patient stating he wants to leave, EDP notified and will go to bedside

## 2017-12-07 NOTE — Discharge Instructions (Signed)
Drink plenty of fluids take Tylenol or Motrin for discomfort.  Follow-up if not improving.

## 2018-11-09 IMAGING — CR DG ABDOMEN 1V
2 series · 2 of 2 positions shown · non-contrast
Comparison: 06/19/2007 CT

CLINICAL DATA: Severe diarrhea this morning.

EXAM:
ABDOMEN - 1 VIEW

[t abdomen supine (1 of 2)]
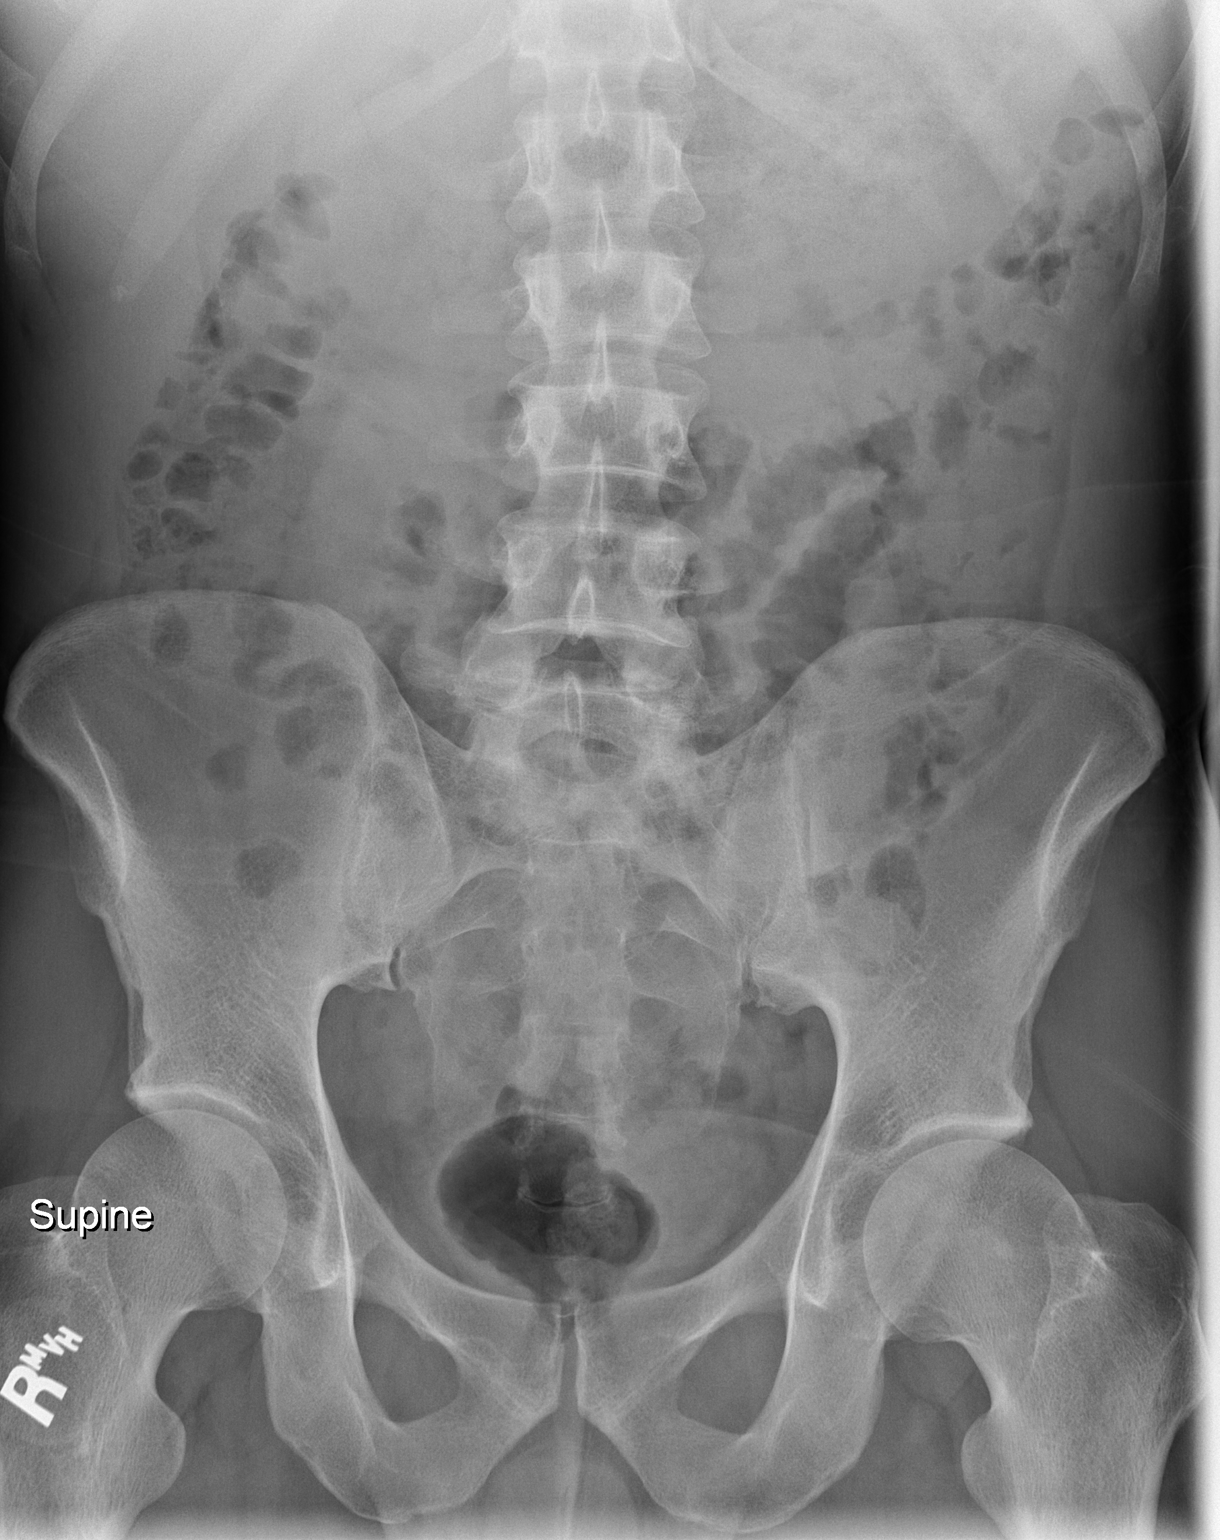

[t abdomen supine (2 of 2)]
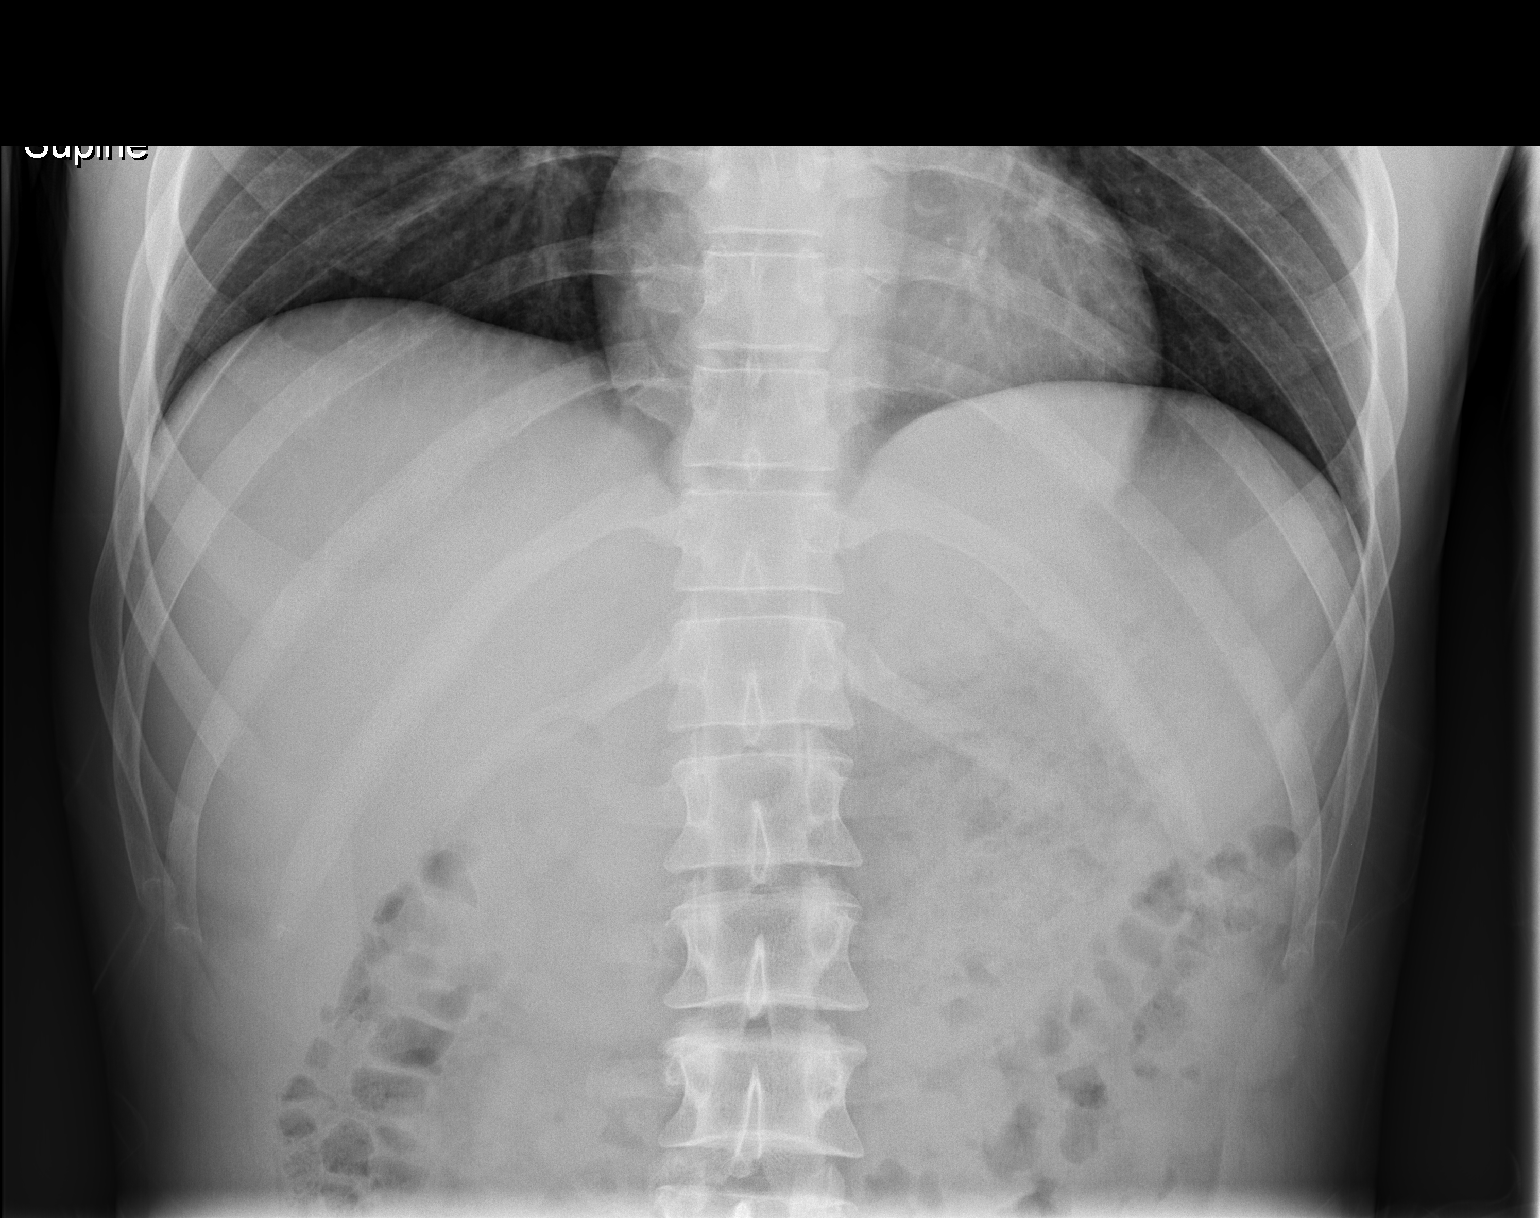

[2 of 2 positions shown; findings below may reference images not displayed]

FINDINGS: Nonspecific bowel gas pattern with air containing non distended
small and large bowel loops noted. Small-bowel loops in the left
hemiabdomen may reflect stigmata of a mild enteritis given slight
submucosal and possible fold thickening. No acute osseous
abnormality. No radiopaque calculi.
IMPRESSION: There is a nonspecific bowel gas pattern without evidence of
obstruction. Possible slight submucosal and/or fold thickening
involving segments of small bowel in the left hemiabdomen raising
the possibility of a small bowel enteritis.
